# Patient Record
Sex: Female | Born: 1965 | Race: Black or African American | Marital: Single
Health system: Southern US, Community
[De-identification: ages and names within clinical notes are randomized; demographics above are authoritative.]

---

## 2014-10-23 ENCOUNTER — Ambulatory Visit: Payer: Self-pay | Admitting: Family Medicine
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
## 2019-11-17 ENCOUNTER — Emergency Department (HOSPITAL_COMMUNITY): Payer: 59

## 2019-11-17 ENCOUNTER — Other Ambulatory Visit: Payer: Self-pay

## 2019-11-17 ENCOUNTER — Emergency Department (HOSPITAL_COMMUNITY)
Admission: EM | Admit: 2019-11-17 | Discharge: 2019-11-18 | Disposition: A | Discharge status: Home / Self Care | Payer: 59 | Attending: Emergency Medicine | Admitting: Emergency Medicine

## 2019-11-17 DIAGNOSIS — S82001A Unspecified fracture of right patella, initial encounter for closed fracture: Secondary | ICD-10-CM | POA: Insufficient documentation

## 2019-11-17 DIAGNOSIS — W01198A Fall on same level from slipping, tripping and stumbling with subsequent striking against other object, initial encounter: Secondary | ICD-10-CM | POA: Insufficient documentation

## 2019-11-17 DIAGNOSIS — Y92524 Gas station as the place of occurrence of the external cause: Secondary | ICD-10-CM | POA: Diagnosis not present

## 2019-11-17 DIAGNOSIS — S82021A Displaced longitudinal fracture of right patella, initial encounter for closed fracture: Secondary | ICD-10-CM

## 2019-11-17 DIAGNOSIS — Y939 Activity, unspecified: Secondary | ICD-10-CM | POA: Insufficient documentation

## 2019-11-17 DIAGNOSIS — Y999 Unspecified external cause status: Secondary | ICD-10-CM | POA: Diagnosis not present

## 2019-11-17 DIAGNOSIS — S8991XA Unspecified injury of right lower leg, initial encounter: Secondary | ICD-10-CM | POA: Diagnosis present

## 2019-11-17 DIAGNOSIS — W19XXXA Unspecified fall, initial encounter: Secondary | ICD-10-CM

## 2019-11-17 MED ORDER — IBUPROFEN 400 MG PO TABS
600.0000 mg | ORAL_TABLET | Freq: Once | ORAL | Status: DC
  Filled 2019-11-17: qty 1

## 2019-11-17 MED ORDER — HYDROCODONE-ACETAMINOPHEN 5-325 MG PO TABS: 1.0000 | ORAL_TABLET | ORAL | 0 refills | Status: AC | PRN

## 2019-11-17 MED ORDER — IBUPROFEN 600 MG PO TABS: 600.0000 mg | ORAL_TABLET | Freq: Four times a day (QID) | ORAL | 0 refills | Status: AC | PRN

## 2019-11-17 NOTE — ED Provider Notes (Signed)
Attestation: Medical screening examination/treatment/procedure(s) were conducted as a shared visit with non-physician practitioner(s) and myself.  I personally evaluated the patient during the encounter.   Briefly, the patient is a 54 y.o. female with here for right knee pain following mechanical fall.   Vitals:   11/17/19 0033 11/17/19 0130  BP: 138/74 133/79  Pulse: 75 81  Resp: 16   Temp: 98.2 F (36.8 C)   SpO2: 100% 96%    CONSTITUTIONAL:  well-appearing, NAD NEURO:  Alert and oriented x 3, no focal deficits EYES:  pupils equal and reactive ENT/NECK:  trachea midline, no JVD CARDIO:  reg rate, reg rhythm, well-perfused PULM:  None labored breathing GI/GU:  Abdomin non-distended MSK/SPINE:  Knee immobilizer in place SKIN:  no rash, atraumatic PSYCH:  Appropriate speech and behavior   EKG Interpretation  Date/Time:    Ventricular Rate:    PR Interval:    QRS Duration:   QT Interval:    QTC Calculation:   R Axis:     Text Interpretation:         Work up notable for patellar fracture. Immobilized. Nira Conn, MD 11/17/19 0630

## 2019-11-17 NOTE — ED Provider Notes (Signed)
[facility] EMERGENCY DEPARTMENT Provider Note   CSN: [id number] Arrival date & time: 11/17/19  0025     History Chief Complaint  Patient presents with  . Knee Pain    Kim Oki is a 54 y.o. female.  Patient to ED with complaint of right knee pain after fall this evening. She tripped and landed on the right knee causing pain and swelling. She has been able to bear weight since. No other injury.   The history is provided by the patient. No language interpreter was used.  Knee Pain Location:  Knee      No past medical history on file.  There are no problems to display for this patient.  OB History   No obstetric history on file.     No family history on file.  Social History   Tobacco Use  . Smoking status: Not on file  Substance Use Topics  . Alcohol use: Not on file  . Drug use: Not on file    Home Medications Prior to Admission medications   Not on File    Allergies    Patient has no allergy information on record.  Review of Systems   Review of Systems  Musculoskeletal:       See HPI.  Skin: Negative.  Negative for wound.  Neurological: Negative.  Negative for weakness and numbness.    Physical Exam Updated Vital Signs BP 133/79   Pulse 81   Temp 98.2 F (36.8 C) (Oral)   Resp 16   SpO2 96%   Physical Exam Vitals and nursing note reviewed.  Constitutional:      Appearance: She is well-developed.  Pulmonary:     Effort: Pulmonary effort is normal.  Musculoskeletal:     Cervical back: Normal range of motion.     Comments: Right knee mildly swollen anteriorly. No bony deformity or effusion. No calf or thigh tenderness. FROM ankle and hip. Distal pulse present.   Skin:    General: Skin is warm and dry.     Comments: Superficial abrasion anterior right knee.   Neurological:     Mental Status: She is alert and oriented to person, place, and time.     ED Results / Procedures / Treatments   Labs (all labs  ordered are listed, but only abnormal results are displayed) Labs Reviewed - No data to display  EKG None  Radiology DG Knee AP/LAT W/Sunrise Right  Result Date: 11/17/2019 CLINICAL DATA:  Pain status post fall EXAM: RIGHT KNEE 3 VIEWS COMPARISON:  None. FINDINGS: There is an acute mildly displaced vertical fracture involving the lateral aspect of the patella. There is a suprapatellar joint effusion. IMPRESSION: Acute mildly displaced fracture involving the lateral aspect of the patella. Electronically Signed   By: Katherine Mantle M.D.   On: 11/17/2019 01:13    Procedures Procedures (including critical care time)  Medications Ordered in ED Medications - No data to display  ED Course  I have reviewed the triage vital signs and the nursing notes.  Pertinent labs & imaging results that were available during my care of the patient were reviewed by me and considered in my medical decision making (see chart for details).    MDM Rules/Calculators/A&P                      Patient to ED after fall resulting in right knee pain  Imaging shows a minimally displaced lateral patellar fracture. Will provide knee  immobilizer and crutches. Recommend ibuprofen. Will provide #10 Norco.   The patient lives outside of [place]. Radiology requested to make a disc of her images to take with her at discharge for follow up.   Final Clinical Impression(s) / ED Diagnoses Final diagnoses:  Fall   1. Right patella fracture  Rx / DC Orders ED Discharge Orders    None       Dennie Bible 11/17/19 0203    Fatima Blank, MD 11/17/19 0630

## 2019-11-17 NOTE — Progress Notes (Signed)
Orthopedic Tech Progress Note Patient Details:  Gaia Gullikson [facility] Oct 20, 1965 [id number]  Ortho Devices Type of Ortho Device: Crutches, Knee Immobilizer Ortho Device/Splint Location: rle Ortho Device/Splint Interventions: Ordered, Application, Adjustment   Post Interventions Patient Tolerated: Well Instructions Provided: Care of device, Adjustment of device   Trinna Post 11/17/2019, 2:54 AM

## 2019-11-17 NOTE — ED Notes (Signed)
Ortho tech called for knee immobilizer and crutches Radiology aware of need for imaging disks

## 2019-11-17 NOTE — ED Triage Notes (Signed)
Per pt she was at store getting gas and tripped over can and fell onto concrete onto her right knee. Swelling  and abrasion noted.

## 2019-11-17 NOTE — Discharge Instructions (Addendum)
Please follow up with your orthopedist when you return home. Ice the knee to reduce any swelling. Use the immobilizer and crutches when ambulatory.
